# Patient Record
Sex: Male | Born: 2000 | Race: Black or African American | Hispanic: No | Marital: Single | Smoking: Never smoker
Health system: Southern US, Community
[De-identification: ages and names within clinical notes are randomized; demographics above are authoritative.]

## PROBLEM LIST (undated history)

## (undated) DIAGNOSIS — I10 Essential (primary) hypertension: Secondary | ICD-10-CM

## (undated) DIAGNOSIS — F809 Developmental disorder of speech and language, unspecified: Secondary | ICD-10-CM

## (undated) DIAGNOSIS — J45909 Unspecified asthma, uncomplicated: Secondary | ICD-10-CM

## (undated) HISTORY — DX: Essential (primary) hypertension: I10

## (undated) HISTORY — DX: Unspecified asthma, uncomplicated: J45.909

---
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
## 2021-01-15 ENCOUNTER — Other Ambulatory Visit: Payer: Self-pay

## 2021-01-15 ENCOUNTER — Encounter: Payer: Self-pay | Admitting: Pulmonary Disease

## 2021-01-15 ENCOUNTER — Ambulatory Visit (INDEPENDENT_AMBULATORY_CARE_PROVIDER_SITE_OTHER): Payer: Medicaid Other | Admitting: Pulmonary Disease

## 2021-01-15 DIAGNOSIS — I10 Essential (primary) hypertension: Secondary | ICD-10-CM

## 2021-01-15 DIAGNOSIS — R0683 Snoring: Secondary | ICD-10-CM | POA: Insufficient documentation

## 2021-01-15 NOTE — Progress Notes (Signed)
Subjective:    Patient ID: Kenneth Wise, male    DOB: Sep 13, 2000, 20 y.o.   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 He was diagnosed with early onset hypertension at age 9 and takes lisinopril/hydrochlorothiazide.  Accompanied by mom today who corroborates sleep history and reports compliance with this medication.  He recently had a URI illness and was noted to be hypertensive and was referred to [place].  Mom reports loud snoring for many years. Epworth sleepiness score is 2 and he denies excessive somnolence. Bedtime is between midnight and 1 AM he is always been a night owl.  He generally works up to around 10 PM.  Sleep latency is up to 30 minutes, he sleeps on his left side with 1 pillow, reports 2-3 nocturnal awakenings including nocturia and is out of bed by 8 and sometimes 10 AM feeling rested without headaches but reports dryness of mouth.  His weight has increased by 10 pounds over the last 2 years There is no history suggestive of cataplexy, sleep paralysis or parasomnias   PMH-asthma triggered by seasonal allergies and presenting his cough, he only takes Allegra and Singulair on an as-needed basis and in fact has not taken this for the past 1 year   Social History   Socioeconomic History   Marital status: Single    Spouse name: Not on file   Number of children: Not on file   Years of education: Not on file   Highest education level: Not on file  Occupational History   Not on file  Tobacco Use   Smoking status: Never   Smokeless tobacco: Never  Substance and Sexual Activity   Alcohol use: No   Drug use: No   Sexual activity: Never    Birth control/protection: Abstinence  Other Topics Concern   Not on file  Social History Narrative   Not on file   Social Determinants of Health   Financial Resource Strain: Not on file  Food Insecurity: Not on file  Transportation Needs: Not on file  Physical  Activity: Not on file  Stress: Not on file  Social Connections: Not on file  Intimate Partner Violence: Not on file     Family History  Problem Relation Age of Onset   Diabetes Other    Cancer Other    Hypertension Other    Diabetes Other    Obesity Other     Review of Systems Constitutional: negative for anorexia, fevers and sweats  Eyes: negative for irritation, redness and visual disturbance  Ears, nose, mouth, throat, and face: negative for earaches, epistaxis, nasal congestion and sore throat  Respiratory: negative for cough, dyspnea on exertion, sputum and wheezing  Cardiovascular: negative for chest pain, dyspnea, lower extremity edema, orthopnea, palpitations and syncope  Gastrointestinal: negative for abdominal pain, constipation, diarrhea, melena, nausea and vomiting  Genitourinary:negative for dysuria, frequency and hematuria  Hematologic/lymphatic: negative for bleeding, easy bruising and lymphadenopathy  Musculoskeletal:negative for arthralgias, muscle weakness and stiff joints  Neurological: negative for coordination problems, gait problems, headaches and weakness  Endocrine: negative for diabetic symptoms including polydipsia, polyuria and weight loss     Objective:   Physical Exam  Gen. Pleasant, obese, in no distress, normal affect ENT - no pallor,icterus, no post nasal drip, class 2-3 airway Neck: No JVD, no thyromegaly, no carotid bruits Lungs: no use of accessory muscles, no dullness to percussion, decreased without rales or rhonchi  Cardiovascular: Rhythm regular, heart  sounds  normal, no murmurs or gallops, no peripheral edema Abdomen: soft and non-tender, no hepatosplenomegaly, BS normal. Musculoskeletal: No deformities, no cyanosis or clubbing Neuro:  alert, non focal, no tremors       Assessment & Plan:

## 2021-01-15 NOTE — Patient Instructions (Addendum)
Home sleep study Nasonex - 1 spray each nare - at bedtime

## 2021-01-15 NOTE — Assessment & Plan Note (Signed)
Appears controlled today. Secondary causes should be investigated given early onset

## 2021-01-15 NOTE — Assessment & Plan Note (Signed)
Loud snoring and body habitus makes sleep disordered breathing possible.  We will investigate with a home sleep test  The pathophysiology of obstructive sleep apnea , it's cardiovascular consequences & modes of treatment including CPAP were discused with the patient in detail & they evidenced understanding.  We discussed treatment options and we will treat only if AHI more than 15.  Otherwise weight loss will be the main focus

## 2021-02-07 DIAGNOSIS — E669 Obesity, unspecified: Secondary | ICD-10-CM | POA: Diagnosis not present

## 2021-02-07 DIAGNOSIS — E782 Mixed hyperlipidemia: Secondary | ICD-10-CM | POA: Diagnosis not present

## 2021-02-07 DIAGNOSIS — J301 Allergic rhinitis due to pollen: Secondary | ICD-10-CM | POA: Diagnosis not present

## 2021-02-07 DIAGNOSIS — I10 Essential (primary) hypertension: Secondary | ICD-10-CM | POA: Diagnosis not present

## 2021-02-07 DIAGNOSIS — E559 Vitamin D deficiency, unspecified: Secondary | ICD-10-CM | POA: Diagnosis not present

## 2021-02-07 DIAGNOSIS — J4520 Mild intermittent asthma, uncomplicated: Secondary | ICD-10-CM | POA: Diagnosis not present

## 2021-02-07 DIAGNOSIS — L700 Acne vulgaris: Secondary | ICD-10-CM | POA: Diagnosis not present

## 2021-03-28 ENCOUNTER — Encounter: Payer: Medicaid Other | Attending: Physician Assistant | Admitting: Registered"

## 2021-03-28 ENCOUNTER — Other Ambulatory Visit: Payer: Self-pay

## 2021-03-28 ENCOUNTER — Encounter: Payer: Self-pay | Admitting: Registered"

## 2021-03-28 DIAGNOSIS — I10 Essential (primary) hypertension: Secondary | ICD-10-CM | POA: Diagnosis not present

## 2021-03-28 DIAGNOSIS — Z713 Dietary counseling and surveillance: Secondary | ICD-10-CM | POA: Insufficient documentation

## 2021-03-28 DIAGNOSIS — E782 Mixed hyperlipidemia: Secondary | ICD-10-CM | POA: Diagnosis not present

## 2021-03-28 DIAGNOSIS — E669 Obesity, unspecified: Secondary | ICD-10-CM | POA: Diagnosis not present

## 2021-03-28 NOTE — Patient Instructions (Addendum)
-   Aim to check out [employer] app for at home physical activity.   - Aim to increase physical activity 2 days/week at least 20 min a day each session.    - Aim to balance meals and snacks. See handout for guide.   - Aim to keep daily sodium intake less than 2300 mg a day.

## 2021-03-28 NOTE — Progress Notes (Signed)
Medical Nutrition Therapy  Appointment Start time:  9:45  Appointment End time:  10:41  Primary concerns today: know how to eat healthier  Referral diagnosis: HTN Preferred learning style: no preference indicated Learning readiness: change in progress   NUTRITION ASSESSMENT   Pt states he is trying to lose weight and cut the fat in his belly. States he wants to lose weight because he feels heavy and feels like his chest is saggy and stomach feels uncomfortable. States he feels like he will feel lighter when he loses weight. States when he wears certain pants sometimes they get a little tight due to body fat around it. States he is trying to get rid of fat in belly and thighs. States he feels like people are making comments about his size. States he has been trying to exercise sometimes.   Pt states he tries to eat right but not doing the best at the moment. States he has been drinking a lot of water recently. States he heard he will lose weight if he stays hydrated. Reports he has decreased intake of sodas, sweets, cookies, and candy.   Reports he usually stays up late until he falls asleep between 10 pm - 2 am and will wake up at 9 am or later. States sometimes he will sleep until afternoon times.   Pt expectations: know how to eat healthier   Clinical Medical Hx: HTN  Medications: See list Labs: elevated BP (150/74), elevated Chol (219), elevated LDL Chol (147); an increased from previous labs 6 months beforehand in 02/2020 Notable Signs/Symptoms: none reported  Lifestyle & Dietary Hx  Estimated daily fluid intake: 68 oz Supplements: See list Sleep: 10+ hrs/night Stress / self-care: none reported Current average weekly physical activity: bodyweight exercises (pushups, sit-ups and crunches) 20-30 min but hasn't done them in a while  24-Hr Dietary Recall First Meal: typically skips Snack:  Second Meal (11 am): Ramen noodles + kettle chips Snack:  Third Meal (7-8 pm): green beans  + pasta + meatballs + bbq sauce Snack (9 pm): 2-3 mini chocolate nuggets Beverages: water (4*16.9 oz; 68 oz)   NUTRITION DIAGNOSIS  NB-1.1 Food and nutrition-related knowledge deficit As related to hypertension.  As evidenced by pt verbalizes lack of previous nutrition-related education.   NUTRITION INTERVENTION  Nutrition education (E-1) on the following topics: Nutrition education and counseling. Pt was educated and counseled on hypertension, nutritional ways to lower blood pressure, ways to increase fiber, vitamin, and mineral intake, and ways to increase physical activity. Discussed metabolism and how to balance meals. Pt agreed with goals listed.  Handouts Provided Include  Hypertension Nutrition Therapy  Learning Style & Readiness for Change Teaching method utilized: Visual & Auditory  Demonstrated degree of understanding via: Teach Back  Barriers to learning/adherence to lifestyle change: none identified  Goals Established by Pt Aim to check out [employer] app for at home physical activity.  Aim to increase physical activity 2 days/week at least 20 min a day each session.   Aim to balance meals and snacks. See handout for guide.  Aim to keep daily sodium intake less than 2300 mg a day.    MONITORING & EVALUATION Dietary intake, weekly physical activity.  Next Steps  Patient is to follow-up prn.

## 2021-04-05 DIAGNOSIS — E669 Obesity, unspecified: Secondary | ICD-10-CM | POA: Diagnosis not present

## 2021-04-05 DIAGNOSIS — J4520 Mild intermittent asthma, uncomplicated: Secondary | ICD-10-CM | POA: Diagnosis not present

## 2021-04-05 DIAGNOSIS — I10 Essential (primary) hypertension: Secondary | ICD-10-CM | POA: Diagnosis not present

## 2021-04-05 DIAGNOSIS — E559 Vitamin D deficiency, unspecified: Secondary | ICD-10-CM | POA: Diagnosis not present

## 2021-04-05 DIAGNOSIS — J301 Allergic rhinitis due to pollen: Secondary | ICD-10-CM | POA: Diagnosis not present

## 2021-04-05 DIAGNOSIS — L700 Acne vulgaris: Secondary | ICD-10-CM | POA: Diagnosis not present

## 2021-04-05 DIAGNOSIS — E782 Mixed hyperlipidemia: Secondary | ICD-10-CM | POA: Diagnosis not present

## 2021-04-30 ENCOUNTER — Ambulatory Visit: Payer: Medicaid Other

## 2021-04-30 ENCOUNTER — Other Ambulatory Visit: Payer: Self-pay

## 2021-04-30 DIAGNOSIS — R0683 Snoring: Secondary | ICD-10-CM

## 2021-04-30 DIAGNOSIS — G4733 Obstructive sleep apnea (adult) (pediatric): Secondary | ICD-10-CM

## 2021-05-02 ENCOUNTER — Telehealth: Payer: Self-pay | Admitting: Pulmonary Disease

## 2021-05-02 DIAGNOSIS — G4733 Obstructive sleep apnea (adult) (pediatric): Secondary | ICD-10-CM | POA: Diagnosis not present

## 2021-05-02 NOTE — Telephone Encounter (Signed)
HST showed moderate OSA with AHI 17/ hr ?Suggest autoCPAP  5-15 cm, mask of choice ?OV with me/APP in 6 wks after starting ? ?

## 2021-05-03 NOTE — Telephone Encounter (Signed)
Called and spoke with patient mother per DPR to let her know of HST results. She expressed understanding and stated to proceed with CPAP set up. Order has been placed. Nothing further needed at this time.  ?

## 2021-06-04 DIAGNOSIS — F89 Unspecified disorder of psychological development: Secondary | ICD-10-CM | POA: Diagnosis not present

## 2021-06-11 DIAGNOSIS — G4733 Obstructive sleep apnea (adult) (pediatric): Secondary | ICD-10-CM | POA: Diagnosis not present

## 2021-06-29 DIAGNOSIS — G4733 Obstructive sleep apnea (adult) (pediatric): Secondary | ICD-10-CM | POA: Diagnosis not present

## 2021-07-05 DIAGNOSIS — G4733 Obstructive sleep apnea (adult) (pediatric): Secondary | ICD-10-CM | POA: Diagnosis not present

## 2021-07-06 DIAGNOSIS — L700 Acne vulgaris: Secondary | ICD-10-CM | POA: Diagnosis not present

## 2021-07-06 DIAGNOSIS — I10 Essential (primary) hypertension: Secondary | ICD-10-CM | POA: Diagnosis not present

## 2021-07-06 DIAGNOSIS — J4520 Mild intermittent asthma, uncomplicated: Secondary | ICD-10-CM | POA: Diagnosis not present

## 2021-07-06 DIAGNOSIS — E782 Mixed hyperlipidemia: Secondary | ICD-10-CM | POA: Diagnosis not present

## 2021-07-06 DIAGNOSIS — Z131 Encounter for screening for diabetes mellitus: Secondary | ICD-10-CM | POA: Diagnosis not present

## 2021-07-06 DIAGNOSIS — E669 Obesity, unspecified: Secondary | ICD-10-CM | POA: Diagnosis not present

## 2021-07-06 DIAGNOSIS — E559 Vitamin D deficiency, unspecified: Secondary | ICD-10-CM | POA: Diagnosis not present

## 2021-07-06 DIAGNOSIS — J301 Allergic rhinitis due to pollen: Secondary | ICD-10-CM | POA: Diagnosis not present

## 2021-07-12 DIAGNOSIS — G4733 Obstructive sleep apnea (adult) (pediatric): Secondary | ICD-10-CM | POA: Diagnosis not present

## 2021-08-04 DIAGNOSIS — G4733 Obstructive sleep apnea (adult) (pediatric): Secondary | ICD-10-CM | POA: Diagnosis not present

## 2021-08-11 DIAGNOSIS — G4733 Obstructive sleep apnea (adult) (pediatric): Secondary | ICD-10-CM | POA: Diagnosis not present

## 2021-09-06 DIAGNOSIS — E669 Obesity, unspecified: Secondary | ICD-10-CM | POA: Diagnosis not present

## 2021-09-06 DIAGNOSIS — L700 Acne vulgaris: Secondary | ICD-10-CM | POA: Diagnosis not present

## 2021-09-06 DIAGNOSIS — J301 Allergic rhinitis due to pollen: Secondary | ICD-10-CM | POA: Diagnosis not present

## 2021-09-06 DIAGNOSIS — Z0000 Encounter for general adult medical examination without abnormal findings: Secondary | ICD-10-CM | POA: Diagnosis not present

## 2021-09-06 DIAGNOSIS — E559 Vitamin D deficiency, unspecified: Secondary | ICD-10-CM | POA: Diagnosis not present

## 2021-09-06 DIAGNOSIS — J4520 Mild intermittent asthma, uncomplicated: Secondary | ICD-10-CM | POA: Diagnosis not present

## 2021-09-06 DIAGNOSIS — Z00129 Encounter for routine child health examination without abnormal findings: Secondary | ICD-10-CM | POA: Diagnosis not present

## 2021-09-06 DIAGNOSIS — I10 Essential (primary) hypertension: Secondary | ICD-10-CM | POA: Diagnosis not present

## 2021-09-06 DIAGNOSIS — E782 Mixed hyperlipidemia: Secondary | ICD-10-CM | POA: Diagnosis not present

## 2021-09-11 DIAGNOSIS — G4733 Obstructive sleep apnea (adult) (pediatric): Secondary | ICD-10-CM | POA: Diagnosis not present

## 2021-09-18 ENCOUNTER — Telehealth: Payer: Self-pay | Admitting: Pulmonary Disease

## 2021-09-18 NOTE — Telephone Encounter (Signed)
Called and left voicemail for mother to call office back in regards to cpap machine

## 2021-09-18 NOTE — Telephone Encounter (Signed)
Patient's mother is calling because he doesn't like wearing the CPAP machine on his face. She states he's been compliant but has told her he does not like it. They want to know if there is an alternative he could use. His mother states she saw an add on tv for inspire.

## 2021-09-19 NOTE — Telephone Encounter (Signed)
Dr. Vassie Loll, please advise on the message from [facility] and advise what you recommend.

## 2021-09-21 NOTE — Telephone Encounter (Signed)
Left message to call and schedule appt with Dr. Vassie Loll or app

## 2021-09-21 NOTE — Telephone Encounter (Signed)
Can we call patient or his mother to get patient set up for office visit with either Dr Vassie Loll or app.   Thank you

## 2021-09-26 DIAGNOSIS — U071 COVID-19: Secondary | ICD-10-CM | POA: Diagnosis not present

## 2021-09-26 DIAGNOSIS — J0110 Acute frontal sinusitis, unspecified: Secondary | ICD-10-CM | POA: Diagnosis not present

## 2021-10-01 ENCOUNTER — Ambulatory Visit (INDEPENDENT_AMBULATORY_CARE_PROVIDER_SITE_OTHER): Payer: Medicaid Other | Admitting: Primary Care

## 2021-10-01 ENCOUNTER — Encounter: Payer: Self-pay | Admitting: Primary Care

## 2021-10-01 VITALS — BP 118/78 | HR 65 | Temp 97.9°F | Ht 68.0 in | Wt 226.8 lb

## 2021-10-01 DIAGNOSIS — G4733 Obstructive sleep apnea (adult) (pediatric): Secondary | ICD-10-CM

## 2021-10-01 NOTE — Assessment & Plan Note (Signed)
-    Originally referred for sleep consult due to hypertension. Patient has symptoms of snoring.  No significant daytime sleepiness.  Sleep study in March 2023 showed moderate obstructive sleep apnea, AHI 16.6 an hour.  He was started on auto CPAP, however, he was intolerant to wearing CPAP.  Mother was interested in learning more about inspire device.  I explained to her and the patient that inspire is an implantable device used to treat moderate to severe obstructive sleep apnea in a patient who failed CPAP.  Due to the mild to moderate severity of his sleep apnea and mild clinical symptoms I would first recommend he be tried on oral appliance and work on weight loss before considering inspire device.  They are in agreement with this plan.  Follow-up in 6 months or sooner if needed.

## 2021-10-01 NOTE — Patient Instructions (Signed)
Sleep study in March 2023 showed that you have borderline mild to moderate obstructive sleep apnea, you had on average 16 apneic events an hour. Because you are intolerant to CPAP, weight loss of 10-15 lbs plus oral appliance could be a good treatment option for you. At this time would not recommend inspire device due to you not having severe obstructive sleep apnea.   Recommendations: Focus on side sleeping position or elevate head 30 degrees with wedge pillow Do not drive if experiencing excessive daytime sleepiness or fatigue  Referral: Orthodontics Dr. Irene Limbo re: osa   Follow-up: 6 months with Dr. Vassie Loll or sooner if needed   Sleep Apnea Sleep apnea affects breathing during sleep. It causes breathing to stop for 10 seconds or more, or to become shallow. People with sleep apnea usually snore loudly. It can also increase the risk of: Heart attack. Stroke. Being very overweight (obese). Diabetes. Heart failure. Irregular heartbeat. High blood pressure. The goal of treatment is to help you breathe normally again. What are the causes?  The most common cause of this condition is a collapsed or blocked airway. There are three kinds of sleep apnea: Obstructive sleep apnea. This is caused by a blocked or collapsed airway. Central sleep apnea. This happens when the brain does not send the right signals to the muscles that control breathing. Mixed sleep apnea. This is a combination of obstructive and central sleep apnea. What increases the risk? Being overweight. Smoking. Having a small airway. Being older. Being male. Drinking alcohol. Taking medicines to calm yourself (sedatives or tranquilizers). Having family members with the condition. Having a tongue or tonsils that are larger than normal. What are the signs or symptoms? Trouble staying asleep. Loud snoring. Headaches in the morning. Waking up gasping. Dry mouth or sore throat in the morning. Being sleepy or tired  during the day. If you are sleepy or tired during the day, you may also: Not be able to focus your mind (concentrate). Forget things. Get angry a lot and have mood swings. Feel sad (depressed). Have changes in your personality. Have less interest in sex, if you are male. Be unable to have an erection, if you are male. How is this treated?  Sleeping on your side. Using a medicine to get rid of mucus in your nose (decongestant). Avoiding the use of alcohol, medicines to help you relax, or certain pain medicines (narcotics). Losing weight, if needed. Changing your diet. Quitting smoking. Using a machine to open your airway while you sleep, such as: An oral appliance. This is a mouthpiece that shifts your lower jaw forward. A CPAP device. This device blows air through a mask when you breathe out (exhale). An EPAP device. This has valves that you put in each nostril. A BIPAP device. This device blows air through a mask when you breathe in (inhale) and breathe out. Having surgery if other treatments do not work. Follow these instructions at home: Lifestyle Make changes that your doctor recommends. Eat a healthy diet. Lose weight if needed. Avoid alcohol, medicines to help you relax, and some pain medicines. Do not smoke or use any products that contain nicotine or tobacco. If you need help quitting, ask your doctor. General instructions Take over-the-counter and prescription medicines only as told by your doctor. If you were given a machine to use while you sleep, use it only as told by your doctor. If you are having surgery, make sure to tell your doctor you have sleep apnea. You may need  to bring your device with you. Keep all follow-up visits. Contact a doctor if: The machine that you were given to use during sleep bothers you or does not seem to be working. You do not get better. You get worse. Get help right away if: Your chest hurts. You have trouble breathing in enough  air. You have an uncomfortable feeling in your back, arms, or stomach. You have trouble talking. One side of your body feels weak. A part of your face is hanging down. These symptoms may be an emergency. Get help right away. Call your local emergency services (911 in the [place] Do not wait to see if the symptoms will go away. Do not drive yourself to the hospital. Summary This condition affects breathing during sleep. The most common cause is a collapsed or blocked airway. The goal of treatment is to help you breathe normally while you sleep. This information is not intended to replace advice given to you by your health care provider. Make sure you discuss any questions you have with your health care provider. Document Revised: 08/30/2020 Document Reviewed: 12/31/2019 Elsevier Patient Education  2023 [employer].

## 2021-10-01 NOTE — Progress Notes (Signed)
@Patient  ID: , male    DOB: 20-Aug-2000, 20 y.o.   MRN: [id number]  Chief Complaint  Patient presents with   Follow-up    Referring provider: [id number], MD  HPI: 21 year old male, ever smoked.  Past medical history significant for hypertension and OSA.  10/01/2021 Patient presents today for follow-up OSA. He was originally referred for sleep consult d/t HTN. He had previous symptoms of snoring. He sleeps well at night, no issues. No daytime sleepiness. Blood presure is controlled with medication.  He had a home sleep study in March 2023 that showed evidence of moderate obstructive sleep apnea, AHI 16.6 an hour with SPO2 low 83% (96% baseline).  He was started on auto CPAP 5 to 15 cm H2O but did not tolerate mask. His mother is interested in learning more about inspire device.    Allergies  Allergen Reactions   Amoxicillin Rash   Bee Pollen Other (See Comments)    Sneezing, watery eyes   Omeprazole     Immunization History  Administered Date(s) Administered   Influenza-Unspecified 12/01/2020    Past Medical History:  Diagnosis Date   Asthma    Hypertension    Speech delay     Tobacco History: Social History   Tobacco Use  Smoking Status Never   Passive exposure: Never  Smokeless Tobacco Never   Counseling given: Not Answered   Outpatient Medications Prior to Visit  Medication Sig Dispense Refill   albuterol (PROAIR HFA) 108 (90 Base) MCG/ACT inhaler 2 puff(s) inhaled 4 times a day     ibuprofen (ADVIL) 800 MG tablet Take 1 tablet (800 mg total) by mouth 3 (three) times daily with meals. 21 tablet 0   lisinopril-hydrochlorothiazide (ZESTORETIC) 20-25 MG tablet Take 1 tablet by mouth daily.     montelukast (SINGULAIR) 10 MG tablet Take 10 mg by mouth daily.     PAXLOVID, 300/100, 20 x 150 MG & 10 x 100MG  TBPK Take by mouth 2 (two) times daily.     No facility-administered medications prior to visit.   Review of Systems  Review of Systems   Constitutional: Negative.  Negative for fatigue.  HENT: Negative.    Respiratory: Negative.    Cardiovascular: Negative.      Physical Exam  BP 118/78 (BP Location: Right Arm, Patient Position: Sitting, Cuff Size: Normal)   Pulse 65   Temp 97.9 F (36.6 C) (Oral)   Ht 5\' 8"  (1.727 m)   Wt 226 lb 12.8 oz (102.9 kg)   SpO2 100%   BMI 34.48 kg/m  Physical Exam   Lab Results:  CBC No results found for: "WBC", "RBC", "HGB", "HCT", "PLT", "MCV", "MCH", "MCHC", "RDW", "LYMPHSABS", "MONOABS", "EOSABS", "BASOSABS"  BMET No results found for: "NA", "K", "CL", "CO2", "GLUCOSE", "BUN", "CREATININE", "CALCIUM", "GFRNONAA", "GFRAA"  BNP No results found for: "BNP"  ProBNP No results found for: "PROBNP"  Imaging: No results found.   Assessment & Plan:   Moderate obstructive sleep apnea -  Originally referred for sleep consult due to hypertension. Patient has symptoms of snoring.  No significant daytime sleepiness.  Sleep study in March 2023 showed moderate obstructive sleep apnea, AHI 16.6 an hour.  He was started on auto CPAP, however, he was intolerant to wearing CPAP.  Mother was interested in learning more about inspire device.  I explained to her and the patient that inspire is an implantable device used to treat moderate to severe obstructive sleep apnea in a patient who failed CPAP.  Due to the mild to moderate severity of his sleep apnea and mild clinical symptoms I would first recommend he be tried on oral appliance and work on weight loss before considering inspire device.  They are in agreement with this plan.  Follow-up in 6 months or sooner if needed.  Glenford Bayley, NP 10/01/2021

## 2021-10-12 DIAGNOSIS — G4733 Obstructive sleep apnea (adult) (pediatric): Secondary | ICD-10-CM | POA: Diagnosis not present

## 2021-10-15 DIAGNOSIS — E669 Obesity, unspecified: Secondary | ICD-10-CM | POA: Diagnosis not present

## 2021-10-15 DIAGNOSIS — J4520 Mild intermittent asthma, uncomplicated: Secondary | ICD-10-CM | POA: Diagnosis not present

## 2021-10-15 DIAGNOSIS — I10 Essential (primary) hypertension: Secondary | ICD-10-CM | POA: Diagnosis not present

## 2021-10-15 DIAGNOSIS — J301 Allergic rhinitis due to pollen: Secondary | ICD-10-CM | POA: Diagnosis not present

## 2021-10-15 DIAGNOSIS — E559 Vitamin D deficiency, unspecified: Secondary | ICD-10-CM | POA: Diagnosis not present

## 2021-10-15 DIAGNOSIS — L700 Acne vulgaris: Secondary | ICD-10-CM | POA: Diagnosis not present

## 2021-10-15 DIAGNOSIS — E782 Mixed hyperlipidemia: Secondary | ICD-10-CM | POA: Diagnosis not present

## 2021-10-23 ENCOUNTER — Telehealth: Payer: Self-pay | Admitting: Primary Care

## 2021-10-23 DIAGNOSIS — G4733 Obstructive sleep apnea (adult) (pediatric): Secondary | ICD-10-CM

## 2021-10-23 NOTE — Telephone Encounter (Signed)
Left message for patients mother to call back. ° °

## 2021-10-23 NOTE — Telephone Encounter (Signed)
That dental office informed her that'' the dental office isnt in network/doesn't accept the pts insurance''. Pts mother would like a call/follow up as to an alternative. Please advise. As this has been since 08/28

## 2021-10-29 ENCOUNTER — Telehealth: Payer: Self-pay | Admitting: Primary Care

## 2021-10-29 NOTE — Telephone Encounter (Signed)
I spoke with Kenneth Wise, [facility] regarding this. Dr Ron Parker office does file pt's insurance. The issue would be that his insurance does not want to cover paying for the mouth guard period. I called the pt's mother, Kenneth Wise and explained this to her. She states that this is unacceptable and we should be able to find a place that will allow his insurance to cover a visit and cover the appliance. Holly, I do not know what else to do. She states that they can not afford to pay out of pocket for oral appliance.

## 2021-10-29 NOTE — Telephone Encounter (Signed)
New order faxed to [facility] here in [place]. I called [name] the patients mother and left her a VM to update her on the order and where it was sent to. Nothing further needed at this time

## 2021-10-29 NOTE — Telephone Encounter (Signed)
Patient's mother called stating she did not get a call back. Patient needs a mouth guard for his CPAP machine and the original place we sent the order to does not take his insurance. Can we need a dental office that accepts [facility] [place] or would a DME company have one? Please follow up with mother, Lavella Hammock at [phone]- she states it is okay to leave a voicemail.

## 2021-10-29 NOTE — Telephone Encounter (Signed)
Routing to [facility] for this per protocol

## 2021-10-29 NOTE — Telephone Encounter (Signed)
I would recommend the patient contact their insurance to find a participating provider or a mask fitting if trying to find something to work.

## 2021-10-29 NOTE — Telephone Encounter (Signed)
Can you put in a new order and make it for [facility] instead of [facility]? He accepts medicaid

## 2021-10-29 NOTE — Telephone Encounter (Signed)
New referral in  Thanks so much

## 2021-10-29 NOTE — Telephone Encounter (Signed)
Refer to previous encounter from 9/19- patient's mother spoke with [facility] and they will not cover the mouth guard either. Patient's mother is getting frustrated because no where will cover the mouth guard for the CPAP machine and they cannot afford to pay out of pocket. Spoke with Carry at [employer] and everything they have clings to the face, they do not carry the mouth guard/ 'new' thing that came out.  Can we confirm a location that will cover the mouth guard before sending order?

## 2021-10-30 NOTE — Telephone Encounter (Signed)
ATC [name], patient's mother.  LM to call back when available.

## 2021-11-02 NOTE — Telephone Encounter (Signed)
Spoke with patient's mother Kenneth Wise. She stated that she made a few phone calls and that they may have some options that will work for them. Advised [name] that if they need anything else to just give [place] a call back.   Nothing further needed at this time.

## 2021-11-02 NOTE — Telephone Encounter (Signed)
ATC patient's mother [name]. LVMTCB.

## 2021-11-05 ENCOUNTER — Ambulatory Visit: Payer: Medicaid Other | Admitting: Pulmonary Disease

## 2021-11-11 DIAGNOSIS — G4733 Obstructive sleep apnea (adult) (pediatric): Secondary | ICD-10-CM | POA: Diagnosis not present

## 2021-11-26 ENCOUNTER — Ambulatory Visit (INDEPENDENT_AMBULATORY_CARE_PROVIDER_SITE_OTHER): Payer: Medicaid Other | Admitting: Nurse Practitioner

## 2021-11-26 ENCOUNTER — Encounter: Payer: Self-pay | Admitting: Nurse Practitioner

## 2021-11-26 VITALS — BP 122/72 | HR 66 | Ht 68.0 in | Wt 228.8 lb

## 2021-11-26 DIAGNOSIS — G4733 Obstructive sleep apnea (adult) (pediatric): Secondary | ICD-10-CM | POA: Diagnosis not present

## 2021-11-26 DIAGNOSIS — E669 Obesity, unspecified: Secondary | ICD-10-CM

## 2021-11-26 DIAGNOSIS — E66811 Obesity, class 1: Secondary | ICD-10-CM

## 2021-11-26 NOTE — Progress Notes (Signed)
@Patient  ID: Kenneth Wise, male    DOB: Jan 23, 2001, 21 y.o.   MRN: [id number]  Chief Complaint  Patient presents with   Follow-up    PT f/u for CPAP usage, pt is having problems with the mask covering his face.     Referring provider: Duard Larsen, MD  HPI: 21 year old male, never smoker followed for obstructive sleep apnea. He is a patient of Dr. Bari Mantis and last seen in office 10/01/2021 by Kenneth Napoleon NP. Past medical history significant for hypertension, obesity.   TEST/EVENTS:  04/2021 HST: AHI 16.6/h, SpO2 low 83%  10/01/2021: OV with Kenneth Napoleon NP. Originally referred for evaluation of HTN and snoring. No significant daytime sleepiness. HST from March 2023 with moderate OSA - AHI 16.6. Asked to start CPAP 5-15 but did not tolerate mask. Mother interested in [facility]. Discussed that given his mild to moderate severity and mild clinical symptoms, start with oral appliance and work on weight loss measures before considering inspire device. Referred to orthodontics for fitting.   11/26/2021: Today - follow up Patient presents today with his mother, who helps provide his history. Since they were seen last, they looked into getting oral appliance to treat his mildly moderate OSA. This is too expensive and they decided to not move forward with this. He is wearing CPAP every night but he does not like to use it. The mask makes him feel claustrophobic. He's tried a few different types; currently using a nasal mask. He has not tried a nasal pillow but they are interested in this. They're also curious what other options he has for treating his sleep apnea. Denies morning headaches or excessive daytime fatigue. He does not drive.   Allergies  Allergen Reactions   Amoxicillin Rash   Bee Pollen Other (See Comments)    Sneezing, watery eyes   Omeprazole     Immunization History  Administered Date(s) Administered   Influenza-Unspecified 12/01/2020    Past Medical History:  Diagnosis Date    Asthma    Hypertension    Speech delay     Tobacco History: Social History   Tobacco Use  Smoking Status Never   Passive exposure: Never  Smokeless Tobacco Never   Counseling given: Not Answered   Outpatient Medications Prior to Visit  Medication Sig Dispense Refill   ibuprofen (ADVIL) 800 MG tablet Take 1 tablet (800 mg total) by mouth 3 (three) times daily with meals. 21 tablet 0   lisinopril-hydrochlorothiazide (ZESTORETIC) 20-25 MG tablet Take 1 tablet by mouth daily.     albuterol (PROAIR HFA) 108 (90 Base) MCG/ACT inhaler 2 puff(s) inhaled 4 times a day (Patient not taking: Reported on 11/26/2021)     montelukast (SINGULAIR) 10 MG tablet Take 10 mg by mouth daily. (Patient not taking: Reported on 11/26/2021)     PAXLOVID, 300/100, 20 x 150 MG & 10 x 100MG  TBPK Take by mouth 2 (two) times daily.     No facility-administered medications prior to visit.     Review of Systems:   Constitutional: No weight loss or gain, night sweats, fevers, chills, fatigue, or lassitude. HEENT: No headaches, difficulty swallowing, tooth/dental problems, or sore throat. No sneezing, itching, ear ache, nasal congestion, or post nasal drip CV:  No chest pain, orthopnea, PND, swelling in lower extremities, anasarca, dizziness, palpitations, syncope Resp: +snoring. No shortness of breath with exertion or at rest. No excess mucus or change in color of mucus. No productive or non-productive. No hemoptysis. No wheezing.  No chest  wall deformity GI:  No heartburn, indigestion, abdominal pain, nausea, vomiting, diarrhea, change in bowel habits, loss of appetite, bloody stools.  Neuro: No dizziness or lightheadedness.  Psych: No depression or anxiety. Mood stable.     Physical Exam:  BP 122/72   Pulse 66   Ht 5\' 8"  (1.727 m)   Wt 228 lb 12.8 oz (103.8 kg)   SpO2 100%   BMI 34.79 kg/m   GEN: Pleasant, well-appearing; obese; in no acute distress. HEENT:  Normocephalic and atraumatic. PERRLA.  Sclera white. Nasal turbinates pink, moist and patent bilaterally. No rhinorrhea present. Oropharynx pink and moist, without exudate or edema. No lesions, ulcerations, or postnasal drip. Mallampati II-III NECK:  Supple w/ fair ROM. No JVD present. Normal carotid impulses w/o bruits. Thyroid symmetrical with no goiter or nodules palpated. No lymphadenopathy.   CV: RRR, no m/r/g, no peripheral edema. Pulses intact, +2 bilaterally. No cyanosis, pallor or clubbing. PULMONARY:  Unlabored, regular breathing. Clear bilaterally A&P w/o wheezes/rales/rhonchi. No accessory muscle use.  GI: BS present and normoactive. Soft, non-tender to palpation. No organomegaly or masses detected.  MSK: No erythema, warmth or tenderness. No deformities or joint swelling noted.  Neuro: A/Ox3. No focal deficits noted.   Skin: Warm, no lesions or rashe Psych: Normal affect and behavior. Judgement and thought content appropriate.     Lab Results:  CBC No results found for: "WBC", "RBC", "HGB", "HCT", "PLT", "MCV", "MCH", "MCHC", "RDW", "LYMPHSABS", "MONOABS", "EOSABS", "BASOSABS"  BMET No results found for: "NA", "K", "CL", "CO2", "GLUCOSE", "BUN", "CREATININE", "CALCIUM", "GFRNONAA", "GFRAA"  BNP No results found for: "BNP"   Imaging:  No results found.        No data to display          No results found for: "NITRICOXIDE"      Assessment & Plan:   Moderate obstructive sleep apnea Mildly moderate OSA with AHI 16.6. No significant daytime symptoms. He has loud snoring at night. Unable to afford oral appliance. He is currently using CPAP but has trouble tolerating it; doing better with nasal mask. He is interested in trying nasal pillow mask - order sent today. Encouraged him to continue with CPAP, if possible. They will look at ordering an anti snoring oral appliance online as well; discussed that this is not as effective as CPAP or orthodontist fitted appliance. We also discussed working on weight  loss measures and positional sleeping.  We discussed how untreated sleep apnea puts an individual at risk for cardiac arrhthymias, pulm HTN, DM, stroke and increases their risk for daytime accidents.   Patient Instructions  Continue to use CPAP every night, minimum of 4-6 hours a night.  Change equipment every 30 days or as directed by DME. Wash your tubing with warm soap and water daily, hang to dry. Wash humidifier portion weekly.  Be aware of reduced alertness and do not drive or operate heavy machinery if experiencing this or drowsiness.  Exercise encouraged, as tolerated. Healthy weight management discussed.  Avoid or decrease alcohol consumption and medications that make you more sleepy, if possible. Notify if persistent daytime sleepiness occurs even with consistent use of CPAP.  Order sent for nasal pillow mask  You can order an oral appliance, anti snoring device/jaw positioning device, online and try this to see if this works for your snoring. Try without CPAP and see what your snoring is like.   Follow up in 6 weeks with Dr. or Vassie Loll. If symptoms do not improve or  worsen, please contact office for sooner follow up or seek emergency care.       Obesity (BMI 30.0-34.9) BMI 34.7. Encouraged healthy weight loss measures   I spent 32 minutes of dedicated to the care of this patient on the date of this encounter to include pre-visit review of records, face-to-face time with the patient discussing conditions above, post visit ordering of testing, clinical documentation with the electronic health record, making appropriate referrals as documented, and communicating necessary findings to members of the patients care team.  Noemi Chapel, NP 11/26/2021  Pt aware and understands NP's role.

## 2021-11-26 NOTE — Assessment & Plan Note (Signed)
Mildly moderate OSA with AHI 16.6. No significant daytime symptoms. He has loud snoring at night. Unable to afford oral appliance. He is currently using CPAP but has trouble tolerating it; doing better with nasal mask. He is interested in trying nasal pillow mask - order sent today. Encouraged him to continue with CPAP, if possible. They will look at ordering an anti snoring oral appliance online as well; discussed that this is not as effective as CPAP or orthodontist fitted appliance. We also discussed working on weight loss measures and positional sleeping.  We discussed how untreated sleep apnea puts an individual at risk for cardiac arrhthymias, pulm HTN, DM, stroke and increases their risk for daytime accidents.   Patient Instructions  Continue to use CPAP every night, minimum of 4-6 hours a night.  Change equipment every 30 days or as directed by DME. Wash your tubing with warm soap and water daily, hang to dry. Wash humidifier portion weekly.  Be aware of reduced alertness and do not drive or operate heavy machinery if experiencing this or drowsiness.  Exercise encouraged, as tolerated. Healthy weight management discussed.  Avoid or decrease alcohol consumption and medications that make you more sleepy, if possible. Notify if persistent daytime sleepiness occurs even with consistent use of CPAP.  Order sent for nasal pillow mask  You can order an oral appliance, anti snoring device/jaw positioning device, online and try this to see if this works for your snoring. Try without CPAP and see what your snoring is like.   Follow up in 6 weeks with Dr. Elsworth Soho or Alanson Aly. If symptoms do not improve or worsen, please contact office for sooner follow up or seek emergency care.

## 2021-11-26 NOTE — Patient Instructions (Signed)
Continue to use CPAP every night, minimum of 4-6 hours a night.  Change equipment every 30 days or as directed by DME. Wash your tubing with warm soap and water daily, hang to dry. Wash humidifier portion weekly.  Be aware of reduced alertness and do not drive or operate heavy machinery if experiencing this or drowsiness.  Exercise encouraged, as tolerated. Healthy weight management discussed.  Avoid or decrease alcohol consumption and medications that make you more sleepy, if possible. Notify if persistent daytime sleepiness occurs even with consistent use of CPAP.  Order sent for nasal pillow mask  You can order an oral appliance, anti snoring device/jaw positioning device, online and try this to see if this works for your snoring. Try without CPAP and see what your snoring is like.   Follow up in 6 weeks with Dr. Elsworth Soho or Alanson Aly. If symptoms do not improve or worsen, please contact office for sooner follow up or seek emergency care.

## 2021-11-26 NOTE — Assessment & Plan Note (Signed)
BMI 34.7. Encouraged healthy weight loss measures

## 2021-11-29 ENCOUNTER — Telehealth: Payer: Self-pay | Admitting: Nurse Practitioner

## 2021-11-29 NOTE — Telephone Encounter (Signed)
ResMed AirFit nasal pillow mask. They will need to fit him to determine appropriate size.

## 2021-11-29 NOTE — Telephone Encounter (Signed)
Is there a specific kind of nasal pillow that you wanted the patient to have? Please advise?

## 2021-11-29 NOTE — Telephone Encounter (Signed)
Called and spoke with pt's mother Lavella Hammock letting her know the info per [facility] and she verbalized understanding. Nothing further needed.

## 2021-12-04 DIAGNOSIS — Z23 Encounter for immunization: Secondary | ICD-10-CM | POA: Diagnosis not present

## 2021-12-04 DIAGNOSIS — J301 Allergic rhinitis due to pollen: Secondary | ICD-10-CM | POA: Diagnosis not present

## 2021-12-04 DIAGNOSIS — I10 Essential (primary) hypertension: Secondary | ICD-10-CM | POA: Diagnosis not present

## 2021-12-04 DIAGNOSIS — L700 Acne vulgaris: Secondary | ICD-10-CM | POA: Diagnosis not present

## 2021-12-04 DIAGNOSIS — E669 Obesity, unspecified: Secondary | ICD-10-CM | POA: Diagnosis not present

## 2021-12-04 DIAGNOSIS — E559 Vitamin D deficiency, unspecified: Secondary | ICD-10-CM | POA: Diagnosis not present

## 2021-12-04 DIAGNOSIS — J4520 Mild intermittent asthma, uncomplicated: Secondary | ICD-10-CM | POA: Diagnosis not present

## 2021-12-04 DIAGNOSIS — E782 Mixed hyperlipidemia: Secondary | ICD-10-CM | POA: Diagnosis not present

## 2021-12-05 DIAGNOSIS — G4733 Obstructive sleep apnea (adult) (pediatric): Secondary | ICD-10-CM | POA: Diagnosis not present

## 2021-12-12 DIAGNOSIS — G4733 Obstructive sleep apnea (adult) (pediatric): Secondary | ICD-10-CM | POA: Diagnosis not present

## 2022-01-07 ENCOUNTER — Ambulatory Visit: Payer: Medicaid Other | Admitting: Nurse Practitioner

## 2022-01-11 DIAGNOSIS — G4733 Obstructive sleep apnea (adult) (pediatric): Secondary | ICD-10-CM | POA: Diagnosis not present

## 2022-02-11 DIAGNOSIS — G4733 Obstructive sleep apnea (adult) (pediatric): Secondary | ICD-10-CM | POA: Diagnosis not present

## 2022-03-05 DIAGNOSIS — J301 Allergic rhinitis due to pollen: Secondary | ICD-10-CM | POA: Diagnosis not present

## 2022-03-05 DIAGNOSIS — E782 Mixed hyperlipidemia: Secondary | ICD-10-CM | POA: Diagnosis not present

## 2022-03-05 DIAGNOSIS — I10 Essential (primary) hypertension: Secondary | ICD-10-CM | POA: Diagnosis not present

## 2022-03-05 DIAGNOSIS — E669 Obesity, unspecified: Secondary | ICD-10-CM | POA: Diagnosis not present

## 2022-03-05 DIAGNOSIS — L700 Acne vulgaris: Secondary | ICD-10-CM | POA: Diagnosis not present

## 2022-03-05 DIAGNOSIS — J4520 Mild intermittent asthma, uncomplicated: Secondary | ICD-10-CM | POA: Diagnosis not present

## 2022-03-05 DIAGNOSIS — E559 Vitamin D deficiency, unspecified: Secondary | ICD-10-CM | POA: Diagnosis not present

## 2022-03-14 DIAGNOSIS — G4733 Obstructive sleep apnea (adult) (pediatric): Secondary | ICD-10-CM | POA: Diagnosis not present

## 2022-04-16 ENCOUNTER — Ambulatory Visit (HOSPITAL_BASED_OUTPATIENT_CLINIC_OR_DEPARTMENT_OTHER): Payer: Medicaid Other | Admitting: Pulmonary Disease

## 2022-06-07 DIAGNOSIS — G4733 Obstructive sleep apnea (adult) (pediatric): Secondary | ICD-10-CM | POA: Diagnosis not present

## 2022-07-25 DIAGNOSIS — J069 Acute upper respiratory infection, unspecified: Secondary | ICD-10-CM | POA: Diagnosis not present

## 2022-12-11 DIAGNOSIS — G4733 Obstructive sleep apnea (adult) (pediatric): Secondary | ICD-10-CM | POA: Diagnosis not present

## 2022-12-26 DIAGNOSIS — I10 Essential (primary) hypertension: Secondary | ICD-10-CM | POA: Diagnosis not present

## 2022-12-26 DIAGNOSIS — Z0000 Encounter for general adult medical examination without abnormal findings: Secondary | ICD-10-CM | POA: Diagnosis not present

## 2022-12-26 DIAGNOSIS — Z23 Encounter for immunization: Secondary | ICD-10-CM | POA: Diagnosis not present
